# Patient Record
Sex: Male | Born: 1960 | Race: White | Hispanic: No | Marital: Married | Smoking: Current every day smoker
Health system: Southern US, Community
[De-identification: ages and names within clinical notes are randomized; demographics above are authoritative.]

## PROBLEM LIST (undated history)

## (undated) DIAGNOSIS — K769 Liver disease, unspecified: Secondary | ICD-10-CM

## (undated) DIAGNOSIS — R011 Cardiac murmur, unspecified: Secondary | ICD-10-CM

## (undated) DIAGNOSIS — K859 Acute pancreatitis without necrosis or infection, unspecified: Secondary | ICD-10-CM

## (undated) DIAGNOSIS — K649 Unspecified hemorrhoids: Secondary | ICD-10-CM

## (undated) DIAGNOSIS — K7460 Unspecified cirrhosis of liver: Secondary | ICD-10-CM

## (undated) DIAGNOSIS — K219 Gastro-esophageal reflux disease without esophagitis: Secondary | ICD-10-CM

## (undated) HISTORY — DX: Unspecified cirrhosis of liver: K74.60

## (undated) HISTORY — DX: Unspecified hemorrhoids: K64.9

## (undated) HISTORY — DX: Cardiac murmur, unspecified: R01.1

## (undated) HISTORY — DX: Acute pancreatitis without necrosis or infection, unspecified: K85.90

## (undated) HISTORY — DX: Liver disease, unspecified: K76.9

## (undated) HISTORY — DX: Gastro-esophageal reflux disease without esophagitis: K21.9

---
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
## 2006-10-23 ENCOUNTER — Emergency Department (HOSPITAL_COMMUNITY): Admission: EM | Admit: 2006-10-23 | Discharge: 2006-10-24 | Payer: Self-pay | Admitting: Emergency Medicine

## 2006-11-10 ENCOUNTER — Encounter: Admission: RE | Admit: 2006-11-10 | Discharge: 2006-11-10 | Payer: Self-pay | Admitting: Gastroenterology

## 2009-01-18 IMAGING — US US ABDOMEN COMPLETE
1 series · 14 of 25 positions shown · non-contrast
Comparison: CT abdomen and pelvis, 10/24/06.

CLINICAL DATA: Follow-up ascites and cirrhosis.  Elevated liver function tests. 
 ABDOMEN ULTRASOUND:
TECHNIQUE: Complete abdominal ultrasound examination was performed including evaluation of the liver, gallbladder, bile ducts, pancreas, kidneys, spleen, IVC, and abdominal aorta.

[Series 1: us abdomen complete · 0.39mm/px · 14 of 81 slices shown]
[im 1/81]
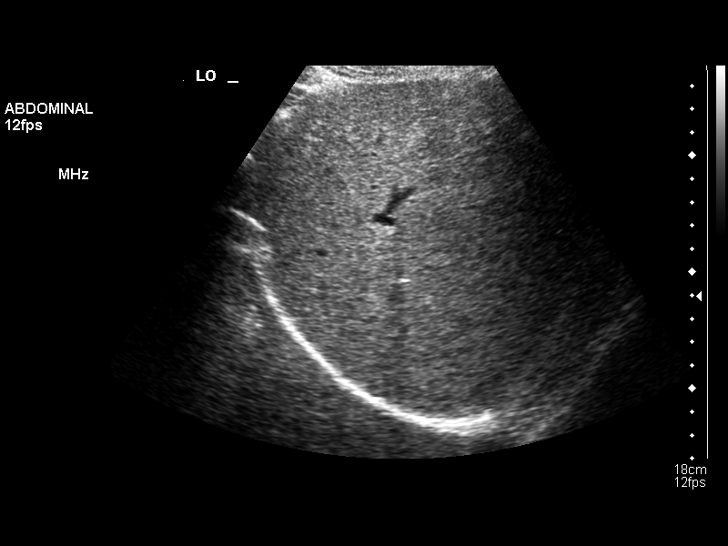
[im 7/81]
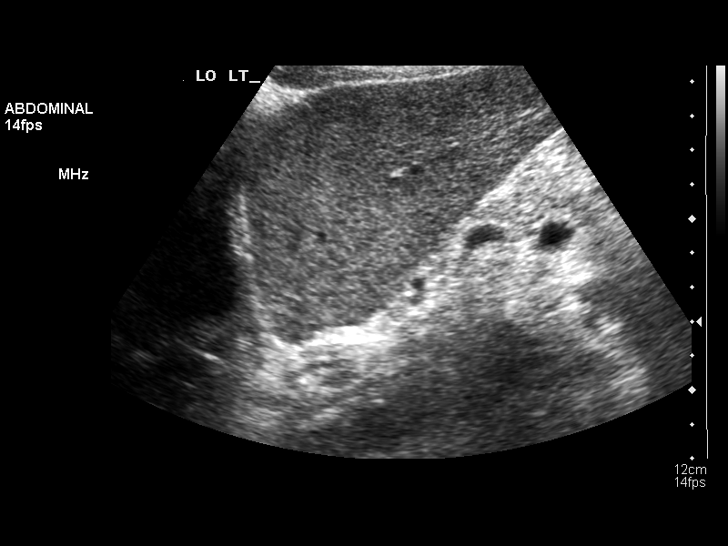
[im 14/81]
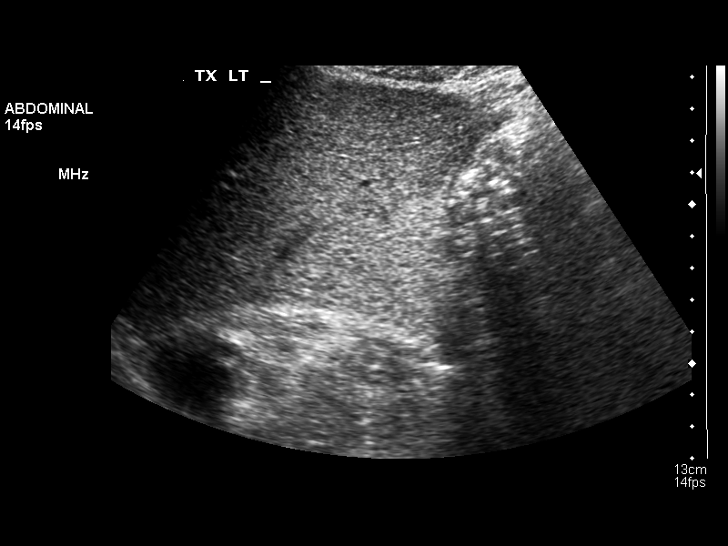
[im 21/81]
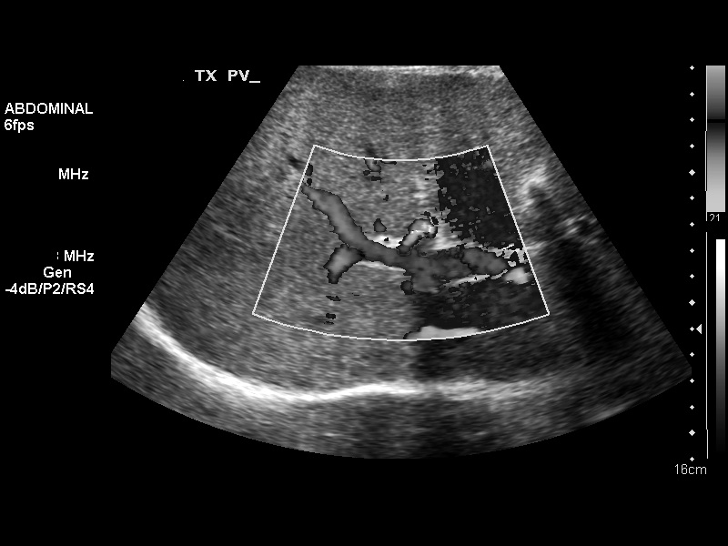
[im 27/81]
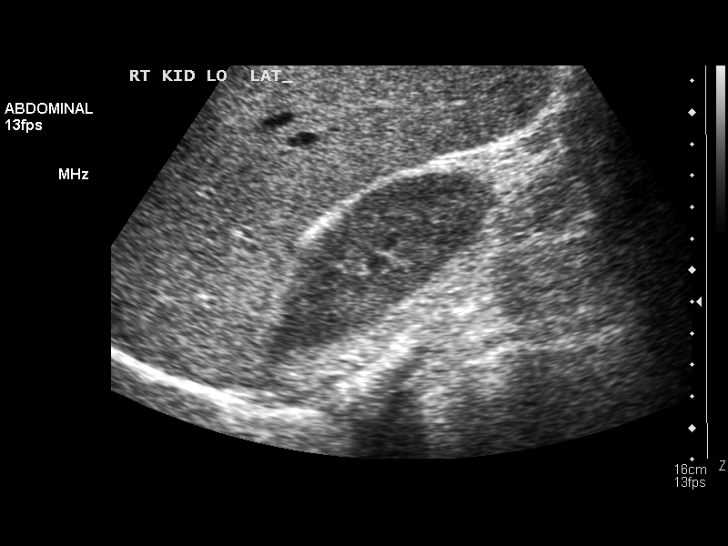
[im 31/81]
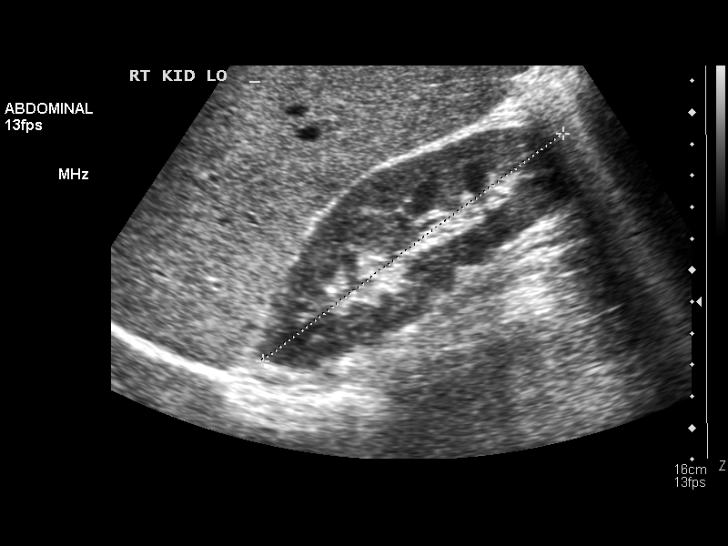
[im 37/81]
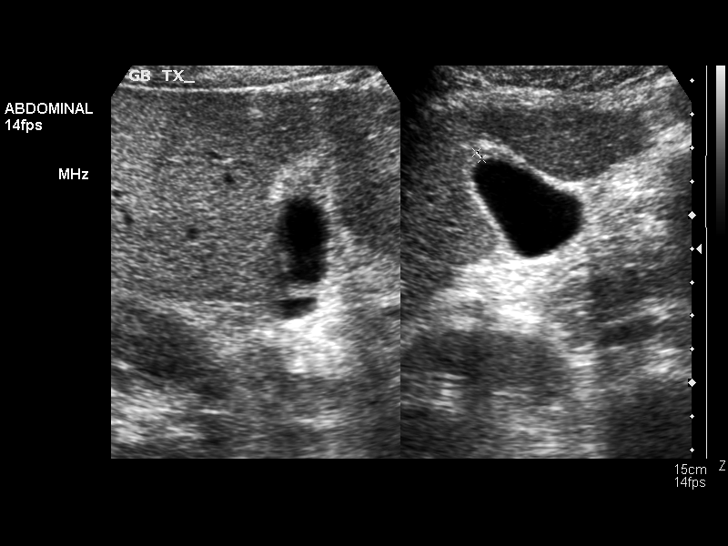
[im 44/81]
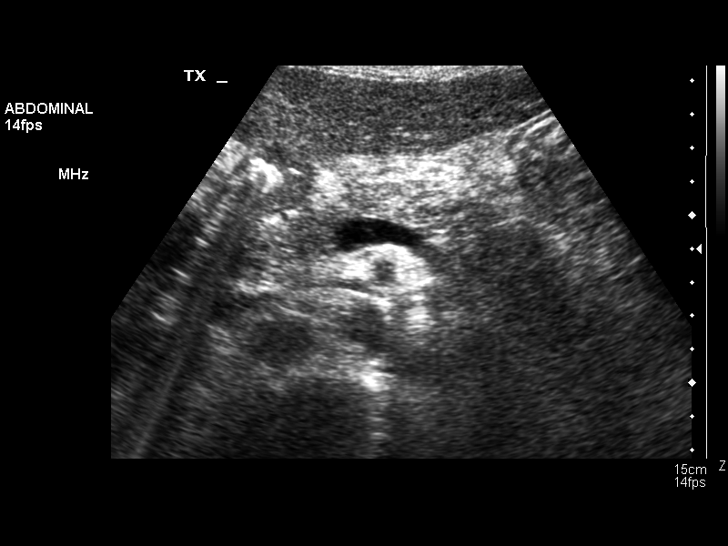
[im 51/81]
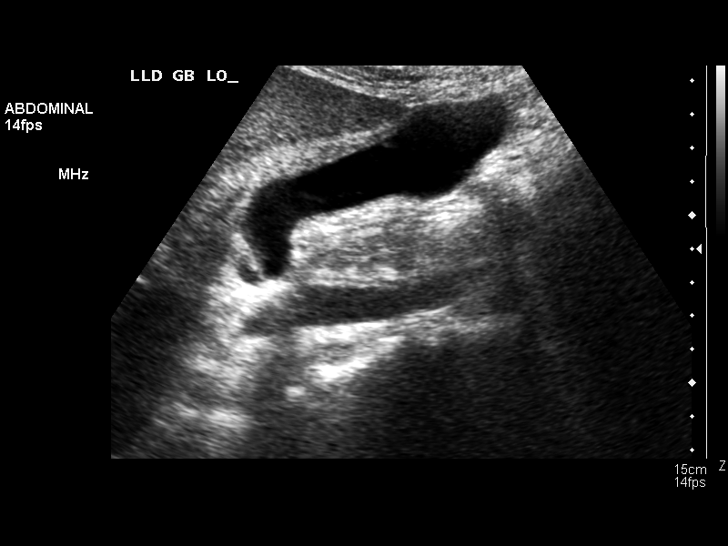
[im 54/81]
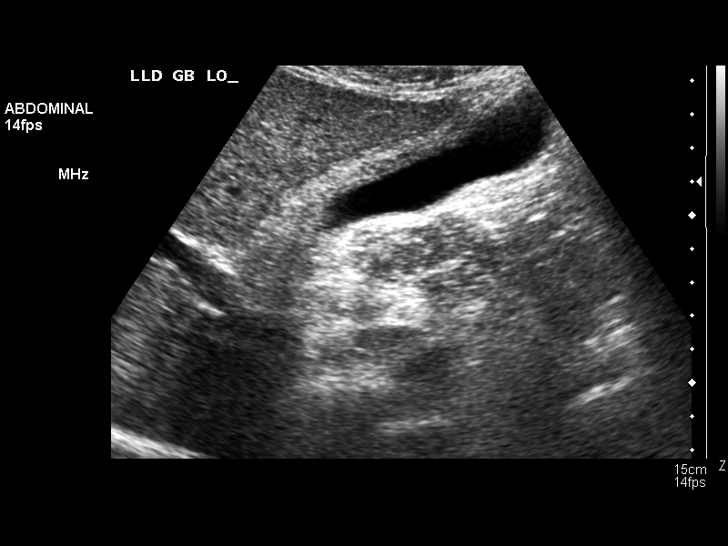
[im 61/81]
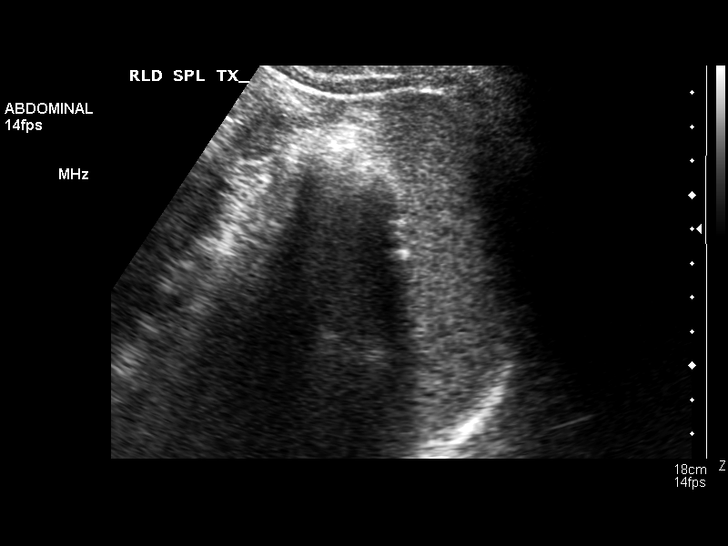
[im 67/81]
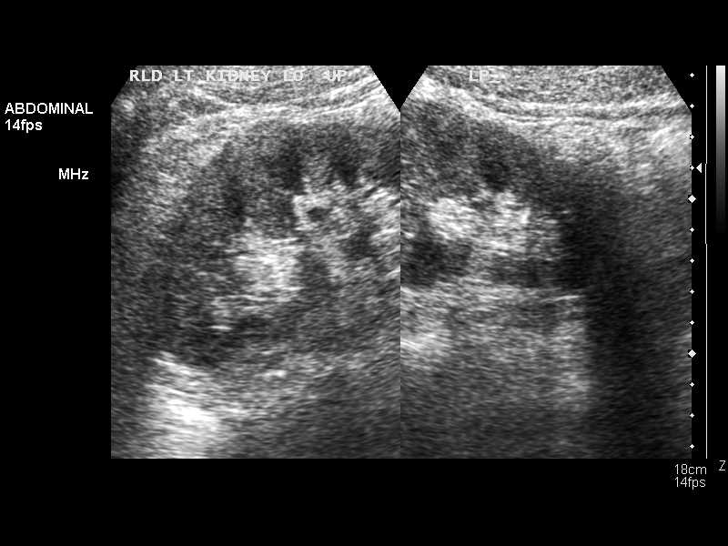
[im 74/81]
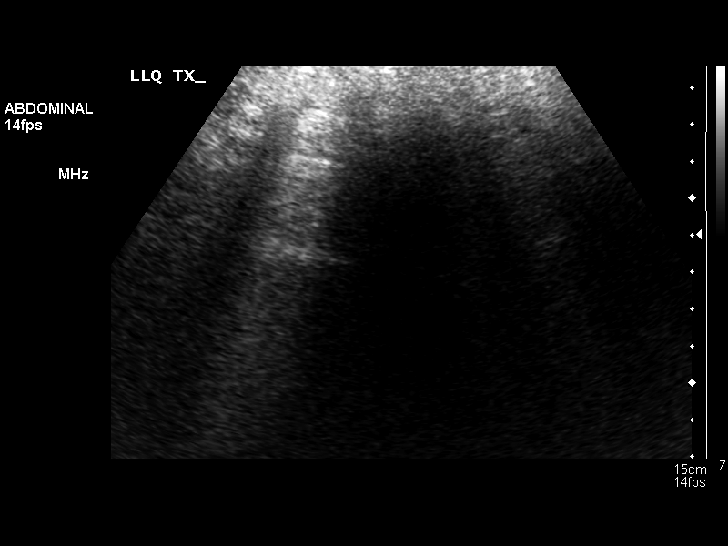
[im 81/81]
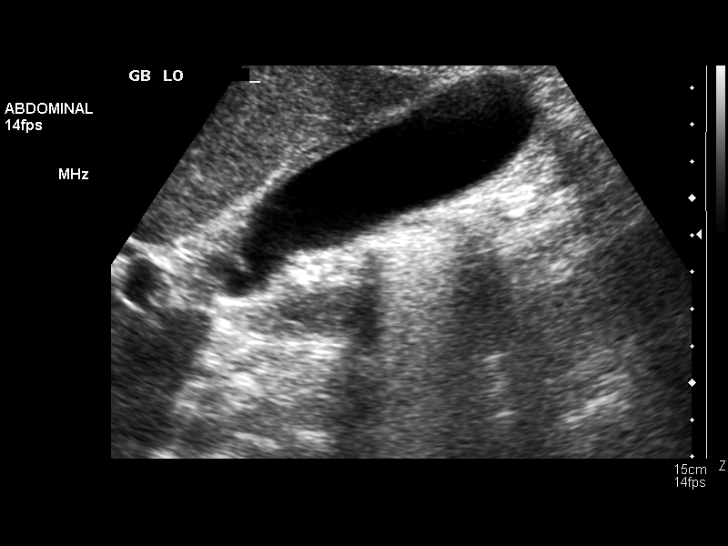

[14 of 25 positions shown; findings below may reference images not displayed]

FINDINGS: Liver is slightly increased in echogenicity.  Gallbladder wall measures up to 6 mm.  No gallstones, pericholecystic fluid, or sonographic Murphy sign.  Visualization of the IVC and pancreatic tail is limited.  Spleen measures 10.9 cm.  Kidneys and aorta are unremarkable.  No ascites.
IMPRESSION: 1.  Interval resolution of ascites.
 2.  Fatty liver.
 3.  Gallbladder wall thickening.  Question hypoproteinemia.

## 2014-05-30 ENCOUNTER — Encounter: Payer: Self-pay | Admitting: *Deleted

## 2014-10-24 ENCOUNTER — Encounter: Payer: Self-pay | Admitting: *Deleted

## 2017-09-04 DIAGNOSIS — I160 Hypertensive urgency: Secondary | ICD-10-CM | POA: Diagnosis not present

## 2017-09-04 DIAGNOSIS — B351 Tinea unguium: Secondary | ICD-10-CM | POA: Diagnosis not present

## 2017-09-04 DIAGNOSIS — S90852A Superficial foreign body, left foot, initial encounter: Secondary | ICD-10-CM | POA: Diagnosis not present

## 2017-09-09 ENCOUNTER — Ambulatory Visit (INDEPENDENT_AMBULATORY_CARE_PROVIDER_SITE_OTHER): Payer: BLUE CROSS/BLUE SHIELD | Admitting: Podiatry

## 2017-09-09 ENCOUNTER — Encounter: Payer: Self-pay | Admitting: Podiatry

## 2017-09-09 VITALS — BP 187/102 | HR 63

## 2017-09-09 DIAGNOSIS — M79674 Pain in right toe(s): Secondary | ICD-10-CM

## 2017-09-09 DIAGNOSIS — E876 Hypokalemia: Secondary | ICD-10-CM | POA: Diagnosis not present

## 2017-09-09 DIAGNOSIS — B351 Tinea unguium: Secondary | ICD-10-CM

## 2017-09-09 DIAGNOSIS — Q828 Other specified congenital malformations of skin: Secondary | ICD-10-CM | POA: Diagnosis not present

## 2017-09-09 DIAGNOSIS — M79675 Pain in left toe(s): Secondary | ICD-10-CM | POA: Diagnosis not present

## 2017-09-09 NOTE — Progress Notes (Signed)
This patient  presents to the office with chief complaint of a painful skin lesion left forefoot.  He says it has been painful for approximately 2 weeks.  Marland Kitchen He states he does not  believe he stepped on a foreign object with his left forefoot.  He says this area is painful when he walks, when he hits this skin lesion the wrong way.  He has provided no self treatment nor sought any professional help.  Marland Kitchen He presents the office today for an evaluation and treatment of this painful skin lesion  General Appearance  Alert, conversant and in no acute stress.  Vascular  Dorsalis pedis and posterior tibial  pulses are palpable  bilaterally.  Capillary return is within normal limits  bilaterally. Temperature is within normal limits  bilaterally.  Neurologic  Senn-Weinstein monofilament wire test within normal limits  bilaterally. Muscle power within normal limits bilaterally.  Nails , normal nails noted  Except hallux nails  B/L  Orthopedic  No limitations of motion of motion feet .  No crepitus or effusions noted.  No bony pathology or digital deformities noted.  Skin  normotropic skin  bilaterally.  No signs of infections or ulcers noted.  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  Porokeratosis    IE  Debride skin lesion.  RTC prn.  . Patient has been given Lamisil to take daily for the fungus toenails, both great toes by his medical doctor   Gardiner Barefoot DPM

## 2017-09-22 ENCOUNTER — Ambulatory Visit (INDEPENDENT_AMBULATORY_CARE_PROVIDER_SITE_OTHER): Payer: BLUE CROSS/BLUE SHIELD | Admitting: Podiatry

## 2017-09-22 ENCOUNTER — Encounter: Payer: Self-pay | Admitting: Podiatry

## 2017-09-22 DIAGNOSIS — M216X2 Other acquired deformities of left foot: Secondary | ICD-10-CM | POA: Diagnosis not present

## 2017-09-22 DIAGNOSIS — Q828 Other specified congenital malformations of skin: Secondary | ICD-10-CM | POA: Diagnosis not present

## 2017-09-22 NOTE — Progress Notes (Signed)
This patient presents to the office stating he still having pain and discomfort in his left forefoot.  He was seen over one week ago and diagnosed with a porokeratosis under the fourth metatarsal left foot.  The skin lesion was to debrided  at that visit.   He says he wore a different pair of shoes and he started to develop sharp shooting pains through the skin lesion.  He says he changes his shoes and the pain resolved.   He presents the office to further discuss this painful skin lesion  General Appearance  Alert, conversant and in no acute stress.  Vascular  Dorsalis pedis and posterior tibial  pulses are palpable  bilaterally.  Capillary return is within normal limits  bilaterally. Temperature is within normal limits  bilaterally.  Neurologic  Senn-Weinstein monofilament wire test within normal limits  bilaterally. Muscle power within normal limits bilaterally.  Nails Thick disfigured discolored nails with subungual debris  from hallux to fifth toes bilaterally. No evidence of bacterial infection or drainage bilaterally.  Orthopedic  No limitations of motion of motion feet .  No crepitus or effusions noted.  No bony pathology or digital deformities noted. Plantar flexed fourth metatarsal left forefoot.    Skin  normotropic skin  noted bilaterally.  No signs of infections or ulcers noted.  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  Porokeratosis sub 4th metatarsal left foot.    . Discussed this condition with this patient.  xplaining to him that the following under the skin lesion was causing his problem.  I told him to use a pumice  stone and padding in his shoes to help relieve any pain.  If the problem persists, we can attempt to remove the skin lesion surgically and/ or perform a metatarsal osteotomy fourth metatarsal left foot.  RTC prn   Gardiner Barefoot DPM

## 2017-10-21 DIAGNOSIS — B351 Tinea unguium: Secondary | ICD-10-CM | POA: Diagnosis not present

## 2017-10-21 DIAGNOSIS — I10 Essential (primary) hypertension: Secondary | ICD-10-CM | POA: Diagnosis not present

## 2017-10-27 ENCOUNTER — Other Ambulatory Visit: Payer: Self-pay | Admitting: Internal Medicine

## 2017-10-27 DIAGNOSIS — I10 Essential (primary) hypertension: Secondary | ICD-10-CM

## 2017-11-05 DIAGNOSIS — I10 Essential (primary) hypertension: Secondary | ICD-10-CM | POA: Diagnosis not present

## 2017-11-05 DIAGNOSIS — Z716 Tobacco abuse counseling: Secondary | ICD-10-CM | POA: Diagnosis not present

## 2017-11-05 DIAGNOSIS — I160 Hypertensive urgency: Secondary | ICD-10-CM | POA: Diagnosis not present

## 2017-11-06 ENCOUNTER — Other Ambulatory Visit: Payer: Self-pay | Admitting: Physician Assistant

## 2017-11-23 ENCOUNTER — Encounter: Payer: Self-pay | Admitting: Cardiology

## 2017-11-23 ENCOUNTER — Ambulatory Visit (INDEPENDENT_AMBULATORY_CARE_PROVIDER_SITE_OTHER): Payer: BLUE CROSS/BLUE SHIELD | Admitting: Cardiology

## 2017-11-23 DIAGNOSIS — F17210 Nicotine dependence, cigarettes, uncomplicated: Secondary | ICD-10-CM | POA: Diagnosis not present

## 2017-11-23 DIAGNOSIS — Z125 Encounter for screening for malignant neoplasm of prostate: Secondary | ICD-10-CM | POA: Diagnosis not present

## 2017-11-23 DIAGNOSIS — Z0000 Encounter for general adult medical examination without abnormal findings: Secondary | ICD-10-CM | POA: Diagnosis not present

## 2017-11-23 DIAGNOSIS — I10 Essential (primary) hypertension: Secondary | ICD-10-CM | POA: Insufficient documentation

## 2017-11-23 DIAGNOSIS — R079 Chest pain, unspecified: Secondary | ICD-10-CM

## 2017-11-23 DIAGNOSIS — Z13220 Encounter for screening for lipoid disorders: Secondary | ICD-10-CM | POA: Diagnosis not present

## 2017-11-23 NOTE — Patient Instructions (Signed)
Medication Instructions:  Your physician recommends that you continue on your current medications as directed. Please refer to the Current Medication list given to you today.  Labwork: None  Testing/Procedures: Your physician has requested that you have a stress echocardiogram. For further information please visit [email or web address]. Please follow instruction sheet as given.  Your physician has requested that you have a renal artery duplex. During this test, an ultrasound is used to evaluate blood flow to the kidneys. Allow one hour for this exam. Do not eat after midnight the day before and avoid carbonated beverages. Take your medications as you usually do.  Follow-Up: Your physician recommends that you schedule a follow-up appointment in: 4 months  Any Other Special Instructions Will Be Listed Below (If Applicable).     If you need a refill on your cardiac medications before your next appointment, please call your pharmacy.   [facility]  Garey HamAshley A, RN, BSN   Cardiopulmonary Exercise Stress Test Cardiopulmonary exercise testing (CPET) is a test that checks how your heart and lungs react to exercise. This is called your exercise capacity. During this test, you will walk or run on a treadmill or pedal on a stationary bike while tests are done on your heart and lungs. You may have this test to:  See why you are short of breath.  Check for exercise intolerance.  See how your lungs work.  See how your heart works.  Check for how you are responding to a heart or lung rehabilitation program.  See if you have a heart or lung problem.  See if you are healthy enough to have surgery.  What happens before the procedure?  Follow instructions from your doctor about what you cannot eat or drink.  Ask your doctor about changing or stopping your normal medicines. This is important if you take diabetes medicines or blood thinners.  Wear loose, comfortable clothing and  shoes.  If you use an inhaler, bring it with you to the test. What happens during the procedure?  A blood pressure cuff will be placed on your arm.  Several stick-on patches (electrodes) will be placed on your chest. They will be attached to an electrocardiogram (EKG) machine.  A clip-on monitor that measures the amount of oxygen in your blood will be placed on your finger (pulse oximeter).  A clip will be placed on your nose and a mouthpiece will be placed in your mouth. This may be held in place with a headpiece. You will breathe through the mouthpiece during the test.  You will be asked to start exercising. You will be closely watched while you exercise.  The amount of effort for your exercise will be gradually increased.  During exercise, the test will measure: ? Your heart rate. ? Your heart rhythm. ? Your oxygen blood level. ? The amount of oxygen and carbon dioxide that you breathe out.  The test will end when: ? You have finished the test. ? You have reached your maximum ability to exercise. ? You have chest or leg pain, dizziness, or shortness of breath. The procedure may vary among doctors and hospitals. What happens after the procedure?  Your blood pressure and EKG will be checked to watch how you recover from the test. This information is not intended to replace advice given to you by your health care provider. Make sure you discuss any questions you have with your health care provider. Document Released: 05/07/2009 Document Revised: 10/09/2015 Document Reviewed: 04/02/2015 Elsevier Interactive Patient Education  2018 [place].

## 2017-11-23 NOTE — Progress Notes (Signed)
Cardiology Office Note:    Date:  11/23/2017   ID:  [name], DOB 1960-06-23, MRN [id number]  PCP:  Terri Piedra, PA-C  Cardiologist:  Garwin Brothers, MD   Referring MD: Joycelyn Rua, MD    ASSESSMENT:    1. Chest pain, unspecified type   2. Cigarette smoker    PLAN:    In order of problems listed above:  1. Primary prevention stressed with the patient.  Importance of compliance with diet and medication stressed and he vocalized understanding.  His blood pressure is now better. 2. I spent 5 minutes with the patient discussing solely about smoking. Smoking cessation was counseled. I suggested to the patient also different medications and pharmacological interventions. Patient is keen to try stopping on its own at this time. He will get back to me if he needs any further assistance in this matter. 3. In view of risk factors for coronary artery disease he will undergo stress echo. 4. Patient will be seen in follow-up appointment in 4 months or earlier if the patient has any concerns    Medication Adjustments/Labs and Tests Ordered: Current medicines are reviewed at length with the patient today.  Concerns regarding medicines are outlined above.  No orders of the defined types were placed in this encounter.  No orders of the defined types were placed in this encounter.    History of Present Illness:    Cristian Dickson is a 57 y.o. male who is being seen today for the evaluation of  Chest discomfort at the request of Joycelyn Rua, MD.  Patient is a pleasant 57 year old male.  The patient has past medical history of essential hypertension.  There is mention of liver disease.  He denies any problems at this time and takes care of activities of daily living.  No chest pain orthopnea or PND.  He has been referred here because of elevated blood pressure.  Renal artery stenosis work-up has been requested.  The patient complains of some chest discomfort at times not  related to exertion.  No orthopnea or PND or any radiation to any part of the body.  At the time of my evaluation, the patient is alert awake oriented and in no distress.  Past Medical History:  Diagnosis Date  . Cirrhosis (HCC)   . Esophageal reflux   . Heart murmur   . Hemorrhoids   . Liver disease   . Pancreatitis     Past Surgical History:  Procedure Laterality Date  . HERNIA REPAIR  1972    Current Medications: Current Meds  Medication Sig  . bisoprolol-hydrochlorothiazide (ZIAC) 10-6.25 MG tablet Take 1 tablet by mouth daily.   Marland Kitchen buPROPion (WELLBUTRIN XL) 150 MG 24 hr tablet Take 1 tablet by mouth daily.  . cloNIDine (CATAPRES) 0.1 MG tablet Take 0.1 mg by mouth at bedtime.  Marland Kitchen olmesartan (BENICAR) 20 MG tablet Take 20 mg by mouth daily.  Marland Kitchen terbinafine (LAMISIL) 250 MG tablet TAKE 1 TABLET BY MOUTH EVERY DAY *30 DAY PER INS*     Allergies:   Patient has no known allergies.   Social History   Socioeconomic History  . Marital status: Single    Spouse name: Not on file  . Number of children: 0  . Years of education: Not on file  . Highest education level: Not on file  Occupational History  . Not on file  Social Needs  . Financial resource strain: Not on file  . Food insecurity:  Worry: Not on file    Inability: Not on file  . Transportation needs:    Medical: Not on file    Non-medical: Not on file  Tobacco Use  . Smoking status: Current Every Day Smoker    Packs/day: 2.50    Types: Cigarettes  . Smokeless tobacco: Never Used  Substance and Sexual Activity  . Alcohol use: Yes    Alcohol/week: 0.0 oz    Comment: 6-8 daily  . Drug use: No  . Sexual activity: Not on file  Lifestyle  . Physical activity:    Days per week: Not on file    Minutes per session: Not on file  . Stress: Not on file  Relationships  . Social connections:    Talks on phone: Not on file    Gets together: Not on file    Attends religious service: Not on file    Active member of  club or organization: Not on file    Attends meetings of clubs or organizations: Not on file    Relationship status: Not on file  Other Topics Concern  . Not on file  Social History Narrative  . Not on file     Family History: The patient's family history includes Diabetes in his brother and mother; Hypertension in his brother and mother.  ROS:   Please see the history of present illness.    All other systems reviewed and are negative.  EKGs/Labs/Other Studies Reviewed:    The following studies were reviewed today: I discussed my findings with the patient at length.  His blood work that was available to me has been unremarkable.  His sodium was low and he mentions to me that he has annual physical blood work coming up later this evening.   Recent Labs: No results found for requested labs within last 8760 hours.  Recent Lipid Panel No results found for: CHOL, TRIG, HDL, CHOLHDL, VLDL, LDLCALC, LDLDIRECT  Physical Exam:    VS:  BP 136/82 (BP Location: Left Arm, Patient Position: Sitting, Cuff Size: Normal)   Pulse 78   Ht 5\' 4"  (1.626 m)   Wt 145 lb (65.8 kg)   SpO2 98%   BMI 24.89 kg/m     Wt Readings from Last 3 Encounters:  11/23/17 145 lb (65.8 kg)     GEN: Patient is in no acute distress HEENT: Normal NECK: No JVD; No carotid bruits LYMPHATICS: No lymphadenopathy CARDIAC: S1 S2 regular, 2/6 systolic murmur at the apex. RESPIRATORY:  Clear to auscultation without rales, wheezing or rhonchi  ABDOMEN: Soft, non-tender, non-distended MUSCULOSKELETAL:  No edema; No deformity  SKIN: Warm and dry NEUROLOGIC:  Alert and oriented x 3 PSYCHIATRIC:  Normal affect    Signed, Garwin Brothersajan R Kinser Fellman, MD  11/23/2017 10:08 AM    [facility]

## 2017-11-25 DIAGNOSIS — E871 Hypo-osmolality and hyponatremia: Secondary | ICD-10-CM | POA: Diagnosis not present

## 2017-12-14 ENCOUNTER — Encounter: Payer: Self-pay | Admitting: Cardiology

## 2017-12-18 ENCOUNTER — Encounter: Payer: Self-pay | Admitting: Cardiology

## 2017-12-21 ENCOUNTER — Other Ambulatory Visit (HOSPITAL_BASED_OUTPATIENT_CLINIC_OR_DEPARTMENT_OTHER): Payer: BLUE CROSS/BLUE SHIELD

## 2017-12-21 ENCOUNTER — Inpatient Hospital Stay (HOSPITAL_BASED_OUTPATIENT_CLINIC_OR_DEPARTMENT_OTHER): Admission: RE | Admit: 2017-12-21 | Payer: BLUE CROSS/BLUE SHIELD | Source: Ambulatory Visit

## 2018-03-16 DIAGNOSIS — I10 Essential (primary) hypertension: Secondary | ICD-10-CM | POA: Diagnosis not present

## 2018-08-26 DIAGNOSIS — I10 Essential (primary) hypertension: Secondary | ICD-10-CM | POA: Diagnosis not present

## 2018-10-22 ENCOUNTER — Ambulatory Visit (INDEPENDENT_AMBULATORY_CARE_PROVIDER_SITE_OTHER): Payer: BLUE CROSS/BLUE SHIELD

## 2018-10-22 ENCOUNTER — Encounter: Payer: Self-pay | Admitting: Podiatry

## 2018-10-22 ENCOUNTER — Other Ambulatory Visit: Payer: Self-pay

## 2018-10-22 ENCOUNTER — Ambulatory Visit (INDEPENDENT_AMBULATORY_CARE_PROVIDER_SITE_OTHER): Payer: BLUE CROSS/BLUE SHIELD | Admitting: Podiatry

## 2018-10-22 VITALS — Temp 97.9°F

## 2018-10-22 DIAGNOSIS — I999 Unspecified disorder of circulatory system: Secondary | ICD-10-CM | POA: Diagnosis not present

## 2018-10-22 DIAGNOSIS — M898X7 Other specified disorders of bone, ankle and foot: Secondary | ICD-10-CM

## 2018-10-22 NOTE — Progress Notes (Signed)
Subjective:   Patient ID: Cristian Dickson, male   DOB: 58 y.o.   MRN: [id number]   HPI Patient presents with a thick callused area of his big toe right that is been present for around 2 months.  States that it is been tender is tried trimming it and nothing has helped him and he did note a little bit of drainage a few days ago but nothing recently.  Patient does smoke approximately 2 packs of cigarettes per day   ROS      Objective:  Physical Exam  Vascular status found to be significantly diminished with no palpable PT or DP pulses slight discoloration to the digits with coolness of the digits noted bilateral.  There is a keratotic lesion on the medial side of the right hallux with irritation of the tissue but no obvious breakdown of tissue noted     Assessment:  Very concerned that were dealing with a vascular issue here that is the main reason why this area is becoming symptomatic and also upon questioning patient does have claudication symptomatology bilateral with the right being worse     Plan:  H&P x-ray reviewed debrided lesion carefully and did not note any active drainage.  Applied padding around the area and instructed on protecting this toe it did explain vascular disease and the possibility at one point in the future this may be an amputation environment for him.  We are sending him for vascular evaluation with possible intervention if feasible.  Patient is encouraged to call us with any changes which may occur before we are able to get him in for vascular consult and request put in today  X-ray indicates the hallux overall looks stable with spur formation but no other indications of pathology with no calcification of arteries noted

## 2018-10-22 NOTE — Addendum Note (Signed)
Addended by: Alphia Kava D on: 10/22/2018 02:13 PM   Modules accepted: Orders

## 2018-10-28 ENCOUNTER — Telehealth: Payer: Self-pay | Admitting: Podiatry

## 2018-10-28 DIAGNOSIS — I999 Unspecified disorder of circulatory system: Secondary | ICD-10-CM

## 2018-10-28 NOTE — Telephone Encounter (Signed)
I called [facility] and cancelled pt's appts. Faxed referral, clinicals and demographics to [facility].

## 2018-10-28 NOTE — Telephone Encounter (Signed)
[facility] called stating that the patient reached out to them and wanted to schedule an appt with their office. Pt had a referral sent to [facility] but would like to have the referral sent to [facility].   Fax# [phone]

## 2018-10-28 NOTE — Addendum Note (Signed)
Addended by: Alphia Kava D on: 10/28/2018 03:38 PM   Modules accepted: Orders

## 2018-11-09 DIAGNOSIS — R0989 Other specified symptoms and signs involving the circulatory and respiratory systems: Secondary | ICD-10-CM | POA: Diagnosis not present

## 2018-11-16 ENCOUNTER — Encounter (HOSPITAL_COMMUNITY): Payer: BLUE CROSS/BLUE SHIELD

## 2018-11-29 DIAGNOSIS — I10 Essential (primary) hypertension: Secondary | ICD-10-CM | POA: Diagnosis not present

## 2018-11-29 DIAGNOSIS — M79662 Pain in left lower leg: Secondary | ICD-10-CM | POA: Diagnosis not present

## 2018-11-29 DIAGNOSIS — M79661 Pain in right lower leg: Secondary | ICD-10-CM | POA: Diagnosis not present

## 2018-11-29 DIAGNOSIS — I739 Peripheral vascular disease, unspecified: Secondary | ICD-10-CM | POA: Diagnosis not present

## 2018-12-07 DIAGNOSIS — I700 Atherosclerosis of aorta: Secondary | ICD-10-CM | POA: Diagnosis not present

## 2018-12-07 DIAGNOSIS — I739 Peripheral vascular disease, unspecified: Secondary | ICD-10-CM | POA: Diagnosis not present

## 2018-12-07 DIAGNOSIS — M79661 Pain in right lower leg: Secondary | ICD-10-CM | POA: Diagnosis not present

## 2018-12-07 DIAGNOSIS — I70213 Atherosclerosis of native arteries of extremities with intermittent claudication, bilateral legs: Secondary | ICD-10-CM | POA: Diagnosis not present

## 2018-12-07 DIAGNOSIS — M79662 Pain in left lower leg: Secondary | ICD-10-CM | POA: Diagnosis not present

## 2019-01-06 DIAGNOSIS — I739 Peripheral vascular disease, unspecified: Secondary | ICD-10-CM | POA: Diagnosis not present

## 2021-01-28 DIAGNOSIS — R300 Dysuria: Secondary | ICD-10-CM | POA: Diagnosis not present

## 2021-01-29 DIAGNOSIS — N410 Acute prostatitis: Secondary | ICD-10-CM | POA: Diagnosis not present

## 2021-02-05 DIAGNOSIS — N400 Enlarged prostate without lower urinary tract symptoms: Secondary | ICD-10-CM | POA: Diagnosis not present

## 2021-02-05 DIAGNOSIS — R102 Pelvic and perineal pain: Secondary | ICD-10-CM | POA: Diagnosis not present

## 2021-03-27 DIAGNOSIS — Z0000 Encounter for general adult medical examination without abnormal findings: Secondary | ICD-10-CM | POA: Diagnosis not present

## 2021-04-04 DIAGNOSIS — Z1211 Encounter for screening for malignant neoplasm of colon: Secondary | ICD-10-CM | POA: Diagnosis not present

## 2021-04-04 DIAGNOSIS — I10 Essential (primary) hypertension: Secondary | ICD-10-CM | POA: Diagnosis not present

## 2021-04-04 DIAGNOSIS — Z125 Encounter for screening for malignant neoplasm of prostate: Secondary | ICD-10-CM | POA: Diagnosis not present

## 2021-07-17 DIAGNOSIS — I739 Peripheral vascular disease, unspecified: Secondary | ICD-10-CM | POA: Diagnosis not present

## 2021-07-17 DIAGNOSIS — F17210 Nicotine dependence, cigarettes, uncomplicated: Secondary | ICD-10-CM | POA: Diagnosis not present

## 2021-08-07 DIAGNOSIS — F17210 Nicotine dependence, cigarettes, uncomplicated: Secondary | ICD-10-CM | POA: Diagnosis not present

## 2021-08-07 DIAGNOSIS — I739 Peripheral vascular disease, unspecified: Secondary | ICD-10-CM | POA: Diagnosis not present

## 2022-03-27 DIAGNOSIS — Z23 Encounter for immunization: Secondary | ICD-10-CM | POA: Diagnosis not present

## 2022-03-27 DIAGNOSIS — I739 Peripheral vascular disease, unspecified: Secondary | ICD-10-CM | POA: Diagnosis not present

## 2022-03-27 DIAGNOSIS — Z0000 Encounter for general adult medical examination without abnormal findings: Secondary | ICD-10-CM | POA: Diagnosis not present

## 2022-03-27 DIAGNOSIS — I10 Essential (primary) hypertension: Secondary | ICD-10-CM | POA: Diagnosis not present

## 2022-04-22 DIAGNOSIS — I10 Essential (primary) hypertension: Secondary | ICD-10-CM | POA: Diagnosis not present

## 2022-05-06 DIAGNOSIS — F17200 Nicotine dependence, unspecified, uncomplicated: Secondary | ICD-10-CM | POA: Diagnosis not present

## 2022-05-06 DIAGNOSIS — I10 Essential (primary) hypertension: Secondary | ICD-10-CM | POA: Diagnosis not present

## 2022-11-19 DIAGNOSIS — D649 Anemia, unspecified: Secondary | ICD-10-CM | POA: Diagnosis not present

## 2022-11-19 DIAGNOSIS — K219 Gastro-esophageal reflux disease without esophagitis: Secondary | ICD-10-CM | POA: Diagnosis not present

## 2022-11-19 DIAGNOSIS — R42 Dizziness and giddiness: Secondary | ICD-10-CM | POA: Diagnosis not present

## 2022-11-19 DIAGNOSIS — I10 Essential (primary) hypertension: Secondary | ICD-10-CM | POA: Diagnosis not present

## 2022-11-19 DIAGNOSIS — F17210 Nicotine dependence, cigarettes, uncomplicated: Secondary | ICD-10-CM | POA: Diagnosis not present

## 2022-11-19 DIAGNOSIS — H6121 Impacted cerumen, right ear: Secondary | ICD-10-CM | POA: Diagnosis not present

## 2022-11-20 DIAGNOSIS — I160 Hypertensive urgency: Secondary | ICD-10-CM | POA: Diagnosis not present

## 2022-11-20 DIAGNOSIS — R9082 White matter disease, unspecified: Secondary | ICD-10-CM | POA: Diagnosis not present

## 2022-11-20 DIAGNOSIS — I471 Supraventricular tachycardia, unspecified: Secondary | ICD-10-CM | POA: Diagnosis not present

## 2022-11-20 DIAGNOSIS — Z66 Do not resuscitate: Secondary | ICD-10-CM | POA: Diagnosis not present

## 2022-11-20 DIAGNOSIS — F17210 Nicotine dependence, cigarettes, uncomplicated: Secondary | ICD-10-CM | POA: Diagnosis not present

## 2022-11-20 DIAGNOSIS — R200 Anesthesia of skin: Secondary | ICD-10-CM | POA: Diagnosis not present

## 2022-11-20 DIAGNOSIS — I639 Cerebral infarction, unspecified: Secondary | ICD-10-CM | POA: Diagnosis not present

## 2022-11-20 DIAGNOSIS — Z7982 Long term (current) use of aspirin: Secondary | ICD-10-CM | POA: Diagnosis not present

## 2022-11-20 DIAGNOSIS — G8191 Hemiplegia, unspecified affecting right dominant side: Secondary | ICD-10-CM | POA: Diagnosis not present

## 2022-11-20 DIAGNOSIS — I739 Peripheral vascular disease, unspecified: Secondary | ICD-10-CM | POA: Diagnosis not present

## 2022-11-20 DIAGNOSIS — I10 Essential (primary) hypertension: Secondary | ICD-10-CM | POA: Diagnosis not present

## 2022-11-20 DIAGNOSIS — I6523 Occlusion and stenosis of bilateral carotid arteries: Secondary | ICD-10-CM | POA: Diagnosis not present

## 2022-11-20 DIAGNOSIS — E871 Hypo-osmolality and hyponatremia: Secondary | ICD-10-CM | POA: Diagnosis not present

## 2022-11-20 DIAGNOSIS — F1010 Alcohol abuse, uncomplicated: Secondary | ICD-10-CM | POA: Diagnosis not present

## 2022-11-20 DIAGNOSIS — D638 Anemia in other chronic diseases classified elsewhere: Secondary | ICD-10-CM | POA: Diagnosis not present

## 2022-11-20 DIAGNOSIS — I359 Nonrheumatic aortic valve disorder, unspecified: Secondary | ICD-10-CM | POA: Diagnosis not present

## 2022-11-20 DIAGNOSIS — T502X5A Adverse effect of carbonic-anhydrase inhibitors, benzothiadiazides and other diuretics, initial encounter: Secondary | ICD-10-CM | POA: Diagnosis not present

## 2022-11-20 DIAGNOSIS — I6782 Cerebral ischemia: Secondary | ICD-10-CM | POA: Diagnosis not present

## 2022-11-20 DIAGNOSIS — Z79899 Other long term (current) drug therapy: Secondary | ICD-10-CM | POA: Diagnosis not present

## 2022-11-20 DIAGNOSIS — I6501 Occlusion and stenosis of right vertebral artery: Secondary | ICD-10-CM | POA: Diagnosis not present

## 2022-11-21 ENCOUNTER — Other Ambulatory Visit: Payer: Self-pay | Admitting: Family Medicine

## 2022-11-21 ENCOUNTER — Other Ambulatory Visit: Payer: Self-pay

## 2022-11-21 DIAGNOSIS — F17200 Nicotine dependence, unspecified, uncomplicated: Secondary | ICD-10-CM

## 2022-11-24 DIAGNOSIS — I471 Supraventricular tachycardia, unspecified: Secondary | ICD-10-CM | POA: Diagnosis not present

## 2022-11-25 DIAGNOSIS — I639 Cerebral infarction, unspecified: Secondary | ICD-10-CM | POA: Diagnosis not present

## 2022-12-05 DIAGNOSIS — E871 Hypo-osmolality and hyponatremia: Secondary | ICD-10-CM | POA: Diagnosis not present

## 2022-12-05 DIAGNOSIS — R531 Weakness: Secondary | ICD-10-CM | POA: Diagnosis not present

## 2022-12-05 DIAGNOSIS — I10 Essential (primary) hypertension: Secondary | ICD-10-CM | POA: Diagnosis not present

## 2022-12-05 DIAGNOSIS — Z1211 Encounter for screening for malignant neoplasm of colon: Secondary | ICD-10-CM | POA: Diagnosis not present

## 2022-12-05 DIAGNOSIS — I639 Cerebral infarction, unspecified: Secondary | ICD-10-CM | POA: Diagnosis not present

## 2022-12-09 DIAGNOSIS — I639 Cerebral infarction, unspecified: Secondary | ICD-10-CM | POA: Diagnosis not present

## 2022-12-12 DIAGNOSIS — R531 Weakness: Secondary | ICD-10-CM | POA: Diagnosis not present

## 2022-12-16 DIAGNOSIS — I639 Cerebral infarction, unspecified: Secondary | ICD-10-CM | POA: Diagnosis not present

## 2022-12-17 DIAGNOSIS — R202 Paresthesia of skin: Secondary | ICD-10-CM | POA: Diagnosis not present

## 2022-12-17 DIAGNOSIS — Z8673 Personal history of transient ischemic attack (TIA), and cerebral infarction without residual deficits: Secondary | ICD-10-CM | POA: Diagnosis not present

## 2022-12-17 DIAGNOSIS — F17210 Nicotine dependence, cigarettes, uncomplicated: Secondary | ICD-10-CM | POA: Diagnosis not present

## 2022-12-17 DIAGNOSIS — F1010 Alcohol abuse, uncomplicated: Secondary | ICD-10-CM | POA: Diagnosis not present

## 2022-12-17 DIAGNOSIS — Z1330 Encounter for screening examination for mental health and behavioral disorders, unspecified: Secondary | ICD-10-CM | POA: Diagnosis not present

## 2022-12-23 DIAGNOSIS — I639 Cerebral infarction, unspecified: Secondary | ICD-10-CM | POA: Diagnosis not present

## 2022-12-24 ENCOUNTER — Ambulatory Visit (INDEPENDENT_AMBULATORY_CARE_PROVIDER_SITE_OTHER): Payer: BC Managed Care – PPO

## 2022-12-24 DIAGNOSIS — F17210 Nicotine dependence, cigarettes, uncomplicated: Secondary | ICD-10-CM | POA: Diagnosis not present

## 2022-12-24 DIAGNOSIS — Z87891 Personal history of nicotine dependence: Secondary | ICD-10-CM | POA: Diagnosis not present

## 2022-12-24 DIAGNOSIS — F17200 Nicotine dependence, unspecified, uncomplicated: Secondary | ICD-10-CM

## 2022-12-26 DIAGNOSIS — I471 Supraventricular tachycardia, unspecified: Secondary | ICD-10-CM | POA: Diagnosis not present

## 2022-12-30 DIAGNOSIS — I639 Cerebral infarction, unspecified: Secondary | ICD-10-CM | POA: Diagnosis not present

## 2022-12-31 DIAGNOSIS — E871 Hypo-osmolality and hyponatremia: Secondary | ICD-10-CM | POA: Diagnosis not present

## 2022-12-31 DIAGNOSIS — I10 Essential (primary) hypertension: Secondary | ICD-10-CM | POA: Diagnosis not present

## 2022-12-31 DIAGNOSIS — R531 Weakness: Secondary | ICD-10-CM | POA: Diagnosis not present

## 2022-12-31 DIAGNOSIS — I639 Cerebral infarction, unspecified: Secondary | ICD-10-CM | POA: Diagnosis not present

## 2023-01-02 DIAGNOSIS — R531 Weakness: Secondary | ICD-10-CM | POA: Diagnosis not present

## 2023-01-07 DIAGNOSIS — I739 Peripheral vascular disease, unspecified: Secondary | ICD-10-CM | POA: Diagnosis not present

## 2023-01-08 DIAGNOSIS — R531 Weakness: Secondary | ICD-10-CM | POA: Diagnosis not present

## 2023-01-13 DIAGNOSIS — R531 Weakness: Secondary | ICD-10-CM | POA: Diagnosis not present

## 2023-01-20 DIAGNOSIS — R531 Weakness: Secondary | ICD-10-CM | POA: Diagnosis not present

## 2023-01-21 DIAGNOSIS — F17210 Nicotine dependence, cigarettes, uncomplicated: Secondary | ICD-10-CM | POA: Diagnosis not present

## 2023-01-21 DIAGNOSIS — I739 Peripheral vascular disease, unspecified: Secondary | ICD-10-CM | POA: Diagnosis not present

## 2023-01-27 DIAGNOSIS — R531 Weakness: Secondary | ICD-10-CM | POA: Diagnosis not present

## 2023-02-03 DIAGNOSIS — R531 Weakness: Secondary | ICD-10-CM | POA: Diagnosis not present

## 2023-02-04 DIAGNOSIS — F17210 Nicotine dependence, cigarettes, uncomplicated: Secondary | ICD-10-CM | POA: Diagnosis not present

## 2023-02-04 DIAGNOSIS — I739 Peripheral vascular disease, unspecified: Secondary | ICD-10-CM | POA: Diagnosis not present

## 2023-02-05 DIAGNOSIS — H11431 Conjunctival hyperemia, right eye: Secondary | ICD-10-CM | POA: Diagnosis not present

## 2023-02-06 DIAGNOSIS — B0230 Zoster ocular disease, unspecified: Secondary | ICD-10-CM | POA: Diagnosis not present

## 2023-02-10 DIAGNOSIS — R531 Weakness: Secondary | ICD-10-CM | POA: Diagnosis not present

## 2023-02-16 DIAGNOSIS — B0230 Zoster ocular disease, unspecified: Secondary | ICD-10-CM | POA: Diagnosis not present

## 2023-02-16 DIAGNOSIS — R531 Weakness: Secondary | ICD-10-CM | POA: Diagnosis not present

## 2023-02-16 DIAGNOSIS — I639 Cerebral infarction, unspecified: Secondary | ICD-10-CM | POA: Diagnosis not present

## 2023-02-18 DIAGNOSIS — I739 Peripheral vascular disease, unspecified: Secondary | ICD-10-CM | POA: Diagnosis not present

## 2023-03-06 DIAGNOSIS — H938X1 Other specified disorders of right ear: Secondary | ICD-10-CM | POA: Diagnosis not present

## 2023-03-19 DIAGNOSIS — I10 Essential (primary) hypertension: Secondary | ICD-10-CM | POA: Diagnosis not present

## 2023-03-19 DIAGNOSIS — Z0000 Encounter for general adult medical examination without abnormal findings: Secondary | ICD-10-CM | POA: Diagnosis not present

## 2023-03-19 DIAGNOSIS — E782 Mixed hyperlipidemia: Secondary | ICD-10-CM | POA: Diagnosis not present

## 2023-03-19 DIAGNOSIS — Z125 Encounter for screening for malignant neoplasm of prostate: Secondary | ICD-10-CM | POA: Diagnosis not present

## 2023-04-15 DIAGNOSIS — I739 Peripheral vascular disease, unspecified: Secondary | ICD-10-CM | POA: Diagnosis not present

## 2023-05-06 DIAGNOSIS — Z7982 Long term (current) use of aspirin: Secondary | ICD-10-CM | POA: Diagnosis not present

## 2023-05-06 DIAGNOSIS — D649 Anemia, unspecified: Secondary | ICD-10-CM | POA: Diagnosis not present

## 2023-05-06 DIAGNOSIS — Z01812 Encounter for preprocedural laboratory examination: Secondary | ICD-10-CM | POA: Diagnosis not present

## 2023-05-06 DIAGNOSIS — I10 Essential (primary) hypertension: Secondary | ICD-10-CM | POA: Diagnosis not present

## 2023-05-06 DIAGNOSIS — I6523 Occlusion and stenosis of bilateral carotid arteries: Secondary | ICD-10-CM | POA: Diagnosis not present

## 2023-05-06 DIAGNOSIS — Z7902 Long term (current) use of antithrombotics/antiplatelets: Secondary | ICD-10-CM | POA: Diagnosis not present

## 2023-05-06 DIAGNOSIS — E785 Hyperlipidemia, unspecified: Secondary | ICD-10-CM | POA: Diagnosis not present

## 2023-05-06 DIAGNOSIS — Z87891 Personal history of nicotine dependence: Secondary | ICD-10-CM | POA: Diagnosis not present

## 2023-05-06 DIAGNOSIS — I69392 Facial weakness following cerebral infarction: Secondary | ICD-10-CM | POA: Diagnosis not present

## 2023-05-06 DIAGNOSIS — I2510 Atherosclerotic heart disease of native coronary artery without angina pectoris: Secondary | ICD-10-CM | POA: Diagnosis not present

## 2023-05-06 DIAGNOSIS — E871 Hypo-osmolality and hyponatremia: Secondary | ICD-10-CM | POA: Diagnosis not present

## 2023-05-06 DIAGNOSIS — Z79899 Other long term (current) drug therapy: Secondary | ICD-10-CM | POA: Diagnosis not present

## 2023-05-06 DIAGNOSIS — I739 Peripheral vascular disease, unspecified: Secondary | ICD-10-CM | POA: Diagnosis not present

## 2023-05-06 DIAGNOSIS — I69351 Hemiplegia and hemiparesis following cerebral infarction affecting right dominant side: Secondary | ICD-10-CM | POA: Diagnosis not present

## 2023-05-06 DIAGNOSIS — F1010 Alcohol abuse, uncomplicated: Secondary | ICD-10-CM | POA: Diagnosis not present

## 2023-05-06 DIAGNOSIS — J439 Emphysema, unspecified: Secondary | ICD-10-CM | POA: Diagnosis not present

## 2023-05-25 DIAGNOSIS — Z87891 Personal history of nicotine dependence: Secondary | ICD-10-CM | POA: Diagnosis not present

## 2023-05-25 DIAGNOSIS — Z7982 Long term (current) use of aspirin: Secondary | ICD-10-CM | POA: Diagnosis not present

## 2023-05-25 DIAGNOSIS — Z7902 Long term (current) use of antithrombotics/antiplatelets: Secondary | ICD-10-CM | POA: Diagnosis not present

## 2023-05-25 DIAGNOSIS — J449 Chronic obstructive pulmonary disease, unspecified: Secondary | ICD-10-CM | POA: Diagnosis not present

## 2023-05-25 DIAGNOSIS — I69351 Hemiplegia and hemiparesis following cerebral infarction affecting right dominant side: Secondary | ICD-10-CM | POA: Diagnosis not present

## 2023-05-25 DIAGNOSIS — I708 Atherosclerosis of other arteries: Secondary | ICD-10-CM | POA: Diagnosis not present

## 2023-05-25 DIAGNOSIS — I70203 Unspecified atherosclerosis of native arteries of extremities, bilateral legs: Secondary | ICD-10-CM | POA: Diagnosis not present

## 2023-05-25 DIAGNOSIS — E785 Hyperlipidemia, unspecified: Secondary | ICD-10-CM | POA: Diagnosis not present

## 2023-05-25 DIAGNOSIS — I743 Embolism and thrombosis of arteries of the lower extremities: Secondary | ICD-10-CM | POA: Diagnosis not present

## 2023-05-25 DIAGNOSIS — I10 Essential (primary) hypertension: Secondary | ICD-10-CM | POA: Diagnosis not present

## 2023-05-25 DIAGNOSIS — K219 Gastro-esophageal reflux disease without esophagitis: Secondary | ICD-10-CM | POA: Diagnosis not present

## 2023-05-25 DIAGNOSIS — Z79899 Other long term (current) drug therapy: Secondary | ICD-10-CM | POA: Diagnosis not present

## 2023-05-25 DIAGNOSIS — I70213 Atherosclerosis of native arteries of extremities with intermittent claudication, bilateral legs: Secondary | ICD-10-CM | POA: Diagnosis not present

## 2023-05-29 DIAGNOSIS — R600 Localized edema: Secondary | ICD-10-CM | POA: Diagnosis not present

## 2023-06-10 DIAGNOSIS — Z1330 Encounter for screening examination for mental health and behavioral disorders, unspecified: Secondary | ICD-10-CM | POA: Diagnosis not present

## 2023-06-10 DIAGNOSIS — Z8673 Personal history of transient ischemic attack (TIA), and cerebral infarction without residual deficits: Secondary | ICD-10-CM | POA: Diagnosis not present

## 2023-06-10 DIAGNOSIS — I739 Peripheral vascular disease, unspecified: Secondary | ICD-10-CM | POA: Diagnosis not present

## 2023-06-10 DIAGNOSIS — I743 Embolism and thrombosis of arteries of the lower extremities: Secondary | ICD-10-CM | POA: Diagnosis not present

## 2023-06-10 DIAGNOSIS — R202 Paresthesia of skin: Secondary | ICD-10-CM | POA: Diagnosis not present

## 2023-07-15 DIAGNOSIS — Z48812 Encounter for surgical aftercare following surgery on the circulatory system: Secondary | ICD-10-CM | POA: Diagnosis not present

## 2023-07-15 DIAGNOSIS — I739 Peripheral vascular disease, unspecified: Secondary | ICD-10-CM | POA: Diagnosis not present

## 2023-09-10 DIAGNOSIS — E782 Mixed hyperlipidemia: Secondary | ICD-10-CM | POA: Diagnosis not present

## 2023-09-10 DIAGNOSIS — I10 Essential (primary) hypertension: Secondary | ICD-10-CM | POA: Diagnosis not present

## 2023-09-22 DIAGNOSIS — M7989 Other specified soft tissue disorders: Secondary | ICD-10-CM | POA: Diagnosis not present

## 2023-10-16 ENCOUNTER — Other Ambulatory Visit: Payer: Self-pay | Admitting: Family Medicine

## 2023-10-16 DIAGNOSIS — F17200 Nicotine dependence, unspecified, uncomplicated: Secondary | ICD-10-CM

## 2024-01-05 DIAGNOSIS — B0229 Other postherpetic nervous system involvement: Secondary | ICD-10-CM | POA: Diagnosis not present

## 2024-01-05 DIAGNOSIS — I6523 Occlusion and stenosis of bilateral carotid arteries: Secondary | ICD-10-CM | POA: Diagnosis not present

## 2024-01-05 DIAGNOSIS — I69331 Monoplegia of upper limb following cerebral infarction affecting right dominant side: Secondary | ICD-10-CM | POA: Diagnosis not present

## 2024-01-13 ENCOUNTER — Ambulatory Visit

## 2024-01-13 DIAGNOSIS — F17200 Nicotine dependence, unspecified, uncomplicated: Secondary | ICD-10-CM

## 2024-01-13 DIAGNOSIS — F17210 Nicotine dependence, cigarettes, uncomplicated: Secondary | ICD-10-CM | POA: Diagnosis not present

## 2024-01-13 DIAGNOSIS — Z122 Encounter for screening for malignant neoplasm of respiratory organs: Secondary | ICD-10-CM

## 2024-01-13 DIAGNOSIS — Z87891 Personal history of nicotine dependence: Secondary | ICD-10-CM | POA: Diagnosis not present

## 2024-02-03 DIAGNOSIS — Z48812 Encounter for surgical aftercare following surgery on the circulatory system: Secondary | ICD-10-CM | POA: Diagnosis not present

## 2024-02-03 DIAGNOSIS — I739 Peripheral vascular disease, unspecified: Secondary | ICD-10-CM | POA: Diagnosis not present

## 2024-02-24 DIAGNOSIS — I739 Peripheral vascular disease, unspecified: Secondary | ICD-10-CM | POA: Diagnosis not present

## 2024-02-24 DIAGNOSIS — Z48812 Encounter for surgical aftercare following surgery on the circulatory system: Secondary | ICD-10-CM | POA: Diagnosis not present

## 2024-03-30 DIAGNOSIS — E782 Mixed hyperlipidemia: Secondary | ICD-10-CM | POA: Diagnosis not present

## 2024-03-30 DIAGNOSIS — I739 Peripheral vascular disease, unspecified: Secondary | ICD-10-CM | POA: Diagnosis not present

## 2024-03-30 DIAGNOSIS — Z0000 Encounter for general adult medical examination without abnormal findings: Secondary | ICD-10-CM | POA: Diagnosis not present

## 2024-03-30 DIAGNOSIS — I10 Essential (primary) hypertension: Secondary | ICD-10-CM | POA: Diagnosis not present

## 2024-04-06 DIAGNOSIS — E782 Mixed hyperlipidemia: Secondary | ICD-10-CM | POA: Diagnosis not present

## 2024-04-06 DIAGNOSIS — I10 Essential (primary) hypertension: Secondary | ICD-10-CM | POA: Diagnosis not present

## 2024-04-20 DIAGNOSIS — I10 Essential (primary) hypertension: Secondary | ICD-10-CM | POA: Diagnosis not present
